# Patient Record
Sex: Male | Born: 1985 | Race: Black or African American | Hispanic: No | Marital: Single | Smoking: Current some day smoker
Health system: Southern US, Community
[De-identification: ages and names within clinical notes are randomized; demographics above are authoritative.]

---

## 2013-10-05 ENCOUNTER — Encounter (HOSPITAL_COMMUNITY): Payer: Self-pay | Admitting: Emergency Medicine

## 2013-10-05 ENCOUNTER — Emergency Department (HOSPITAL_COMMUNITY)

## 2013-10-05 ENCOUNTER — Emergency Department (HOSPITAL_COMMUNITY)
Admission: EM | Admit: 2013-10-05 | Discharge: 2013-10-06 | Attending: Emergency Medicine | Admitting: Emergency Medicine

## 2013-10-05 DIAGNOSIS — S01309A Unspecified open wound of unspecified ear, initial encounter: Secondary | ICD-10-CM | POA: Diagnosis not present

## 2013-10-05 DIAGNOSIS — S01409A Unspecified open wound of unspecified cheek and temporomandibular area, initial encounter: Secondary | ICD-10-CM | POA: Insufficient documentation

## 2013-10-05 DIAGNOSIS — F17200 Nicotine dependence, unspecified, uncomplicated: Secondary | ICD-10-CM | POA: Diagnosis not present

## 2013-10-05 DIAGNOSIS — S0181XA Laceration without foreign body of other part of head, initial encounter: Secondary | ICD-10-CM

## 2013-10-05 DIAGNOSIS — S0003XA Contusion of scalp, initial encounter: Secondary | ICD-10-CM | POA: Diagnosis not present

## 2013-10-05 MED ORDER — IBUPROFEN 800 MG PO TABS
800.0000 mg | ORAL_TABLET | Freq: Once | ORAL | Status: AC
  Administered 2013-10-06: 800 mg via ORAL
  Filled 2013-10-05: qty 1
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
## 2013-10-06 MED ORDER — LIDOCAINE-EPINEPHRINE (PF) 2 %-1:200000 IJ SOLN
INTRAMUSCULAR | Status: AC
  Filled 2013-10-06: qty 20

## 2013-10-06 MED ORDER — IBUPROFEN 800 MG PO TABS: 800.0000 mg | ORAL_TABLET | Freq: Three times a day (TID) | ORAL | Status: DC | PRN

## 2013-10-06 MED ORDER — BACITRACIN-NEOMYCIN-POLYMYXIN 400-5-5000 EX OINT
TOPICAL_OINTMENT | CUTANEOUS | Status: AC
  Filled 2013-10-06: qty 1

## 2013-10-06 MED ORDER — BACITRACIN-NEOMYCIN-POLYMYXIN 400-5-5000 EX OINT
TOPICAL_OINTMENT | Freq: Once | CUTANEOUS | Status: AC
  Administered 2013-10-06: 1 via TOPICAL
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
## 2013-10-06 NOTE — ED Provider Notes (Signed)
Medical screening examination/treatment/procedure(s) were performed by non-physician practitioner and as supervising physician I was immediately available for consultation/collaboration.   Dione Booze, MD 10/06/13 [id number]
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
## 2015-01-05 ENCOUNTER — Encounter (HOSPITAL_COMMUNITY): Payer: Self-pay | Admitting: Adult Health

## 2015-01-05 ENCOUNTER — Emergency Department (HOSPITAL_COMMUNITY)
Admission: EM | Admit: 2015-01-05 | Discharge: 2015-01-05 | Disposition: A | Discharge status: Home / Self Care | Attending: Emergency Medicine | Admitting: Emergency Medicine

## 2015-01-05 DIAGNOSIS — Z720 Tobacco use: Secondary | ICD-10-CM | POA: Insufficient documentation

## 2015-01-05 DIAGNOSIS — Y998 Other external cause status: Secondary | ICD-10-CM | POA: Insufficient documentation

## 2015-01-05 DIAGNOSIS — Y929 Unspecified place or not applicable: Secondary | ICD-10-CM | POA: Insufficient documentation

## 2015-01-05 DIAGNOSIS — X58XXXA Exposure to other specified factors, initial encounter: Secondary | ICD-10-CM | POA: Insufficient documentation

## 2015-01-05 DIAGNOSIS — Z202 Contact with and (suspected) exposure to infections with a predominantly sexual mode of transmission: Secondary | ICD-10-CM

## 2015-01-05 DIAGNOSIS — S39012A Strain of muscle, fascia and tendon of lower back, initial encounter: Secondary | ICD-10-CM | POA: Insufficient documentation

## 2015-01-05 DIAGNOSIS — R350 Frequency of micturition: Secondary | ICD-10-CM | POA: Insufficient documentation

## 2015-01-05 DIAGNOSIS — Y939 Activity, unspecified: Secondary | ICD-10-CM | POA: Insufficient documentation

## 2015-01-05 MED ORDER — AZITHROMYCIN 250 MG PO TABS
1000.0000 mg | ORAL_TABLET | Freq: Once | ORAL | Status: AC
  Administered 2015-01-05: 1000 mg via ORAL
  Filled 2015-01-05: qty 4

## 2015-01-05 MED ORDER — STERILE WATER FOR INJECTION IJ SOLN
2.0000 mL | Freq: Once | INTRAMUSCULAR | Status: AC
  Administered 2015-01-05: 2 mL via INTRAMUSCULAR

## 2015-01-05 MED ORDER — CEFTRIAXONE SODIUM 250 MG IJ SOLR
250.0000 mg | Freq: Once | INTRAMUSCULAR | Status: AC
  Administered 2015-01-05: 250 mg via INTRAMUSCULAR
  Filled 2015-01-05: qty 250

## 2015-01-05 MED ORDER — IBUPROFEN 800 MG PO TABS: 800.0000 mg | ORAL_TABLET | Freq: Three times a day (TID) | ORAL | Status: AC

## 2015-01-05 MED ORDER — STERILE WATER FOR INJECTION IJ SOLN
INTRAMUSCULAR | Status: AC
  Administered 2015-01-05: 17:00:00 2 mL via INTRAMUSCULAR
  Filled 2015-01-05: qty 10

## 2015-01-05 MED ORDER — IBUPROFEN 400 MG PO TABS
800.0000 mg | ORAL_TABLET | Freq: Once | ORAL | Status: AC
  Administered 2015-01-05: 800 mg via ORAL
  Filled 2015-01-05: qty 4

## 2015-01-05 NOTE — ED Notes (Signed)
Declined W/C at D/C and was escorted to lobby by RN. 

## 2015-01-05 NOTE — Discharge Instructions (Signed)
Back Pain, Adult Low back pain is very common. About 1 in 5 people have back pain.The cause of low back pain is rarely dangerous. The pain often gets better over time.About half of people with a sudden onset of back pain feel better in just 2 weeks. About 8 in 10 people feel better by 6 weeks.  CAUSES Some common causes of back pain include:  Strain of the muscles or ligaments supporting the spine.  Wear and tear (degeneration) of the spinal discs.  Arthritis.  Direct injury to the back. DIAGNOSIS Most of the time, the direct cause of low back pain is not known.However, back pain can be treated effectively even when the exact cause of the pain is unknown.Answering your caregiver's questions about your overall health and symptoms is one of the most accurate ways to make sure the cause of your pain is not dangerous. If your caregiver needs more information, he or she may order lab work or imaging tests (X-rays or MRIs).However, even if imaging tests show changes in your back, this usually does not require surgery. HOME CARE INSTRUCTIONS For many people, back pain returns.Since low back pain is rarely dangerous, it is often a condition that people can learn to [facility] their own.   Remain active. It is stressful on the back to sit or stand in one place. Do not sit, drive, or stand in one place for more than 30 minutes at a time. Take short walks on level surfaces as soon as pain allows.Try to increase the length of time you walk each day.  Do not stay in bed.Resting more than 1 or 2 days can delay your recovery.  Do not avoid exercise or work.Your body is made to move.It is not dangerous to be active, even though your back may hurt.Your back will likely heal faster if you return to being active before your pain is gone.  Pay attention to your body when you bend and lift. Many people have less discomfortwhen lifting if they bend their knees, keep the load close to their bodies,and  avoid twisting. Often, the most comfortable positions are those that put less stress on your recovering back.  Find a comfortable position to sleep. Use a firm mattress and lie on your side with your knees slightly bent. If you lie on your back, put a pillow under your knees.  Only take over-the-counter or prescription medicines as directed by your caregiver. Over-the-counter medicines to reduce pain and inflammation are often the most helpful.Your caregiver may prescribe muscle relaxant drugs.These medicines help dull your pain so you can more quickly return to your normal activities and healthy exercise.  Put ice on the injured area.  Put ice in a plastic bag.  Place a towel between your skin and the bag.  Leave the ice on for 15-20 minutes, 03-04 times a day for the first 2 to 3 days. After that, ice and heat may be alternated to reduce pain and spasms.  Ask your caregiver about trying back exercises and gentle massage. This may be of some benefit.  Avoid feeling anxious or stressed.Stress increases muscle tension and can worsen back pain.It is important to recognize when you are anxious or stressed and learn ways to manage it.Exercise is a great option. SEEK MEDICAL CARE IF:  You have pain that is not relieved with rest or medicine.  You have pain that does not improve in 1 week.  You have new symptoms.  You are generally not feeling well. SEEK  IMMEDIATE MEDICAL CARE IF:   You have pain that radiates from your back into your legs.  You develop new bowel or bladder control problems.  You have unusual weakness or numbness in your arms or legs.  You develop nausea or vomiting.  You develop abdominal pain.  You feel faint. Document Released: 11/02/2005 Document Revised: 05/03/2012 Document Reviewed: 03/06/2014 [facility] Patient Information 2015 [place], [place]. This information is not intended to replace advice given to you by your health care provider. Make sure you  discuss any questions you have with your health care provider.  Gonorrhea Gonorrhea is an infection that can cause serious problems. If left untreated, the infection may:   Damage the male or male organs.   Cause women to be unable to have children (sterility).   Harm a fetus if the infected woman is pregnant.  It is important to get treatment for gonorrhea as soon as possible. It is also necessary that all your sexual partners be tested for the infection.  CAUSES  Gonorrhea is caused by bacteria called Neisseria gonorrhoeae. The infection is spread from person to person, usually by sexual contact (such as by anal, vaginal, or oral means). A newborn can contract the infection from his or her mother during birth.  SYMPTOMS  Some people with gonorrhea do not have symptoms. Symptoms may be different in females and males.  Females The most common symptoms are:   Pain in the lower abdomen.   Fever with or without chills.  Other symptoms include:   Abnormal vaginal discharge.   Painful intercourse.   Burning or itching of the vagina or lips of the vagina.   Abnormal vaginal bleeding.   Pain when urinating.   Long-lasting (chronic) pain in the lower abdomen, especially during menstruation or intercourse.   Inability to become pregnant.   Going into premature labor.   Irritation, pain, bleeding, or discharge from the rectum. This may occur if the infection was spread by anal sex.   Sore throat or swollen lymph nodes in the neck. This may occur if the infection was spread by oral sex.  Males The most common symptoms are:   Discharge from the penis.   Pain or burning during urination.   Pain or swelling in the testicles. Other symptoms may include:   Irritation, pain, bleeding, or discharge from the rectum. This may occur if the infection was spread by anal sex.   Sore throat, fever, or swollen lymph nodes in the neck. This may occur if the infection  was spread by oral sex.  DIAGNOSIS  A diagnosis is made after a physical exam is done and a sample of discharge is examined under a microscope for the presence of the bacteria. The discharge may be taken from the urethra, cervix, throat, or rectum.  TREATMENT  Gonorrhea is treated with antibiotic medicines. It is important for treatment to begin as soon as possible. Early treatment may prevent some problems from developing.  HOME CARE INSTRUCTIONS   Take medicines only as directed by your health care provider.   Take your antibiotic medicine as directed by your health care provider. Finish the antibiotic even if you start to feel better. Incomplete treatment will put you at risk for continued infection.   Do not have sex until treatment is complete or as directed by your health care provider.   Keep all follow-up visits as directed by your health care provider.   Not all test results are available during your visit. If your  test results are not back during the visit, make an appointment with your health care provider to find out the results. Do not assume everything is normal if you have not heard from your health care provider or the medical facility. It is your responsibility to get your test results.  If you test positive for gonorrhea, inform your recent sexual partners. They need to be checked for gonorrhea even if they do not have symptoms. They may need treatment, even if they test negative for gonorrhea.  SEEK MEDICAL CARE IF:   You develop any bad reaction to the medicine you were prescribed. This may include:   A rash.   Nausea.   Vomiting.   Diarrhea.   Your symptoms do not improve after a few days of taking antibiotics.   Your symptoms get worse.   You develop increased pain, such as in the testicles (for males) or in the abdomen (for females).  You have a fever. MAKE SURE YOU:   Understand these instructions.  Will watch your condition.  Will get  help right away if you are not doing well or get worse. Document Released: 10/30/2000 Document Revised: 03/19/2014 Document Reviewed: 05/10/2013 [facility] Patient Information 2015 [place], [place]. This information is not intended to replace advice given to you by your health care provider. Make sure you discuss any questions you have with your health care provider.   Emergency Department Resource Guide 1) Find a Doctor and Pay Out of Pocket Although you won't have to find out who is covered by your insurance plan, it is a good idea to ask around and get recommendations. You will then need to call the office and see if the doctor you have chosen will accept you as a new patient and what types of options they offer for patients who are self-pay. Some doctors offer discounts or will set up payment plans for their patients who do not have insurance, but you will need to ask so you aren't surprised when you get to your appointment.  2) Contact Your Local Health Department Not all health departments have doctors that can see patients for sick visits, but many do, so it is worth a call to see if yours does. If you don't know where your local health department is, you can check in your phone book. The CDC also has a tool to help you locate your state's health department, and many state websites also have listings of all of their local health departments.  3) Find a Walk-in Clinic If your illness is not likely to be very severe or complicated, you may want to try a walk in clinic. These are popping up all over the country in pharmacies, drugstores, and shopping centers. They're usually staffed by nurse practitioners or physician assistants that have been trained to treat common illnesses and complaints. They're usually fairly quick and inexpensive. However, if you have serious medical issues or chronic medical problems, these are probably not your best option.  No Primary Care Doctor: - Call Health Connect at   [phone] - they can help you locate a primary care doctor that  accepts your insurance, provides certain services, etc. - Physician Referral Service- [phone]  Chronic Pain Problems: Organization         Address  Phone   Notes  [facility]  [phone] Patients need to be referred by their primary care doctor.   Medication Assistance: Organization         Address  Phone   Notes  [facility] Medication [facility] [address] [place] [place], [place] [address] [phone] --Must be a resident of [facility] -- Must have NO insurance coverage whatsoever (no Medicaid/ Medicare, etc.) -- The pt. MUST have a primary care doctor that directs their care regularly and follows them in the community   [facility]  [phone]   [employer]  [phone]    Agencies that provide inexpensive medical care: Organization         Address  Phone   Notes  [facility]  [phone]   [facility]    [phone]   [facility] [address] [place], [place] [address] [phone]   [facility] [address] [place] [place] [phone]   Planned Parenthood    [phone]   [facility]    [phone]   [facility]  [address], [place] Phone:  [phone], Fax:  [phone] Hours of Operation:  9 am - 6 pm, M-F.  Also accepts Medicaid/Medicare and self-pay.  [facility]  [address], [place] Phone: [phone], Fax: [phone]. Hours of Operation:  8:30 am - 5:30 pm, M-F.  Also accepts Medicaid and self-pay.  [facility] [place] [address] [place] Phone: [phone]   [facility] [address] Natasha Bence [place], [place] [phone], Ext. [phone] Mondays & Thursdays: 7-9 AM.  First 15 patients are seen on a first come, first serve basis.     Medicaid-accepting [facility] Providers:  Organization         Address  Phone   Notes  [facility] [address], [place] [phone] Also accepts self-pay patients.  [facility] [address] Laurell Josephs [place], [place]  [phone]   [facility] [address], [place] [phone]   [facility] [address], [place] [phone]   Renaye Rakers [address], [place]   [phone] Only accepts [place] patients after they have their name applied to their card.   Self-Pay (no insurance) in [facility]:  Organization         Address  Phone   Notes  Sickle Cell Patients, [facility] [address] [place], [place] [phone]   [facility] [address] [place], [place] [phone]   [facility] [place]  [address], [place] [phone]   [facility]/Dr. Osei-Bonsu  [address] [place] or [address], [place] [phone] Phone number for both [place] and [place] locations is the same.  [facility]gent [facility] and [facility] [address], [place] [phone]   [facility] [address] [place] or [address] [phone]   [facility] [address] [place] [phone], phone; [phone], fax Sees patients 1st and 3rd Saturday of every month.  Must not qualify for public or private insurance (i.e. Medicaid, Medicare, [place] Health Choice, Veterans' Benefits)  Household income should be no more than 200% of the poverty level The clinic cannot treat you if you are pregnant or think you are pregnant  Sexually transmitted diseases are not treated at the clinic.    Dental Care: Organization         Address  Phone  Notes  [facility]  Department of [facility] [address] [place], [place] [phone] Accepts children up to age 34 who are enrolled in [place] or [place] Health Choice; pregnant women with a Medicaid card; and children who have applied for Medicaid or [place] Health Choice, but were declined, whose parents can pay a reduced fee at time of service.  [facility] Department of [facility]  [address], [place] [phone] Accepts children up to age 66 who are enrolled in [place] or [place] Health Choice; pregnant women with a Medicaid card; and children who have applied for Medicaid or [place] Health Choice, but were declined, whose parents can pay a reduced fee at time of service.  [facility]  [address] [place], [place] [phone] Patients are seen by appointment only. Walk-ins are not accepted. [facility] will see patients 57 years of age and older. Monday - Tuesday (8am-5pm) Most Wednesdays (8:30-5pm) $30 per visit, cash only  [facility] Adult Dental Access PROGRAM  [address], [facility] [phone] Patients are seen by appointment only. Walk-ins are not accepted. [facility] will see patients 38 years of age and older. One Wednesday Evening (Monthly: Volunteer Based).  $30 per visit, cash only  [employer]  [phone] for adults; Children under age 77, call Graduate Pediatric Dentistry at [phone]. Children aged 56-14, please call [phone] to request a pediatric application.  Dental services are provided in all areas of dental care including fillings, crowns and bridges, complete and partial dentures, implants, gum treatment, root canals, and extractions. Preventive care is also provided. Treatment is provided to both adults and children. Patients are selected via a lottery and there is often a waiting list.   [facility] [address], [place]  [phone]  [email or web address]   [facility] [address] [place], [place] [phone], Ext. [phone] Second and Fourth Thursday of each month, opens at 6:30 AM; Clinic ends at 9 AM.  Patients are seen on a first-come first-served basis, and a limited number are seen during each clinic.   [facility]  [address] Ether Griffins [place], [place] [phone]   Eligibility Requirements You must have lived in [place], [place], or [place] counties for at least the last three months.   You cannot be eligible for state or federal sponsored [place], including [employer], [place], or [employer].   You generally cannot be eligible for healthcare insurance through your employer.    How to apply: Eligibility screenings are held every Tuesday and Wednesday afternoon from 1:00 pm until 4:00 pm. You do not need an appointment for the interview!  [facility] [address], [place], [place] [phone]   [facility]  [phone]   [facility] Health [facility]  [phone]   [facility] Health Department  [phone]    Behavioral Health Resources in the Community: Intensive Outpatient Programs Organization         Address  Phone  Notes  [facility] [address] [place], [place] [phone]   [facility] [address] [place], [place] [phone]   [facility] [address] [place], [place]  [phone]   [facility] [address],  [place], [place] [phone] or [phone]   Substance Abuse Resources Organization         Address  Phone  Notes  [facility]  [phone]   [facility]  [phone]   [facility]  [phone]   [facility]  [phone]   Residential & Outpatient Substance Abuse Program  [phone]   Psychological Services Organization          Address  Phone  Notes  [facility]  [phone]- [phone]   [facility]  [phone]- [phone]   [facility] [address], [place] [phone] or [phone]    Mobile Crisis Teams Organization         Address  Phone  Notes  [facility], Mobile Crisis Care Unit  [phone]   [facility]  [address] [place], [place] [phone]   Doristine LocksSharon DeEsch [address] [place] [phone]    Self-Help/Support Groups Organization         Address  Phone             Notes  [facility] [place] - variety of support groups  336- [phone] Call for more information  Narcotics Anonymous (NA), [facility] [address], [place]  2 meetings at this location   Statisticianesidential Treatment Programs Organization         Address  Phone  Notes  [facility] [address],    [place]  [phone]   [facility]  [address], [place] [phone], [place], [place] [phone]   [facility] [address], [place] [phone] Admissions: 8am-3pm M-F  [facility] [address]    [place], [place] [phone]   [facility] [address], [place], [place] [phone]   [facility] [address]  [place], [place] [phone]   [facility] - Intensive Outpatient [address], [place], [place] [phone]   [facility] ([facility] [address]  [place], [place] [phone] or [phone]   [facility] ([facility]) [address] [place], [place] [phone] Accepts Medicaid  [facility] [address]  [place] [phone] Substance Abuse/Addiction Treatment   [facility] Resources Organization         Address  Phone  Notes  [facility]  [phone]   Angie FavaJulie Brannon, PhD [address] [place], [place]   [phone](336) [phone] or [phone]   [facility]   [address] [place], [place] [phone]) [phone]   [facility] [address], [place], [place] [phone]) [phone] Insurance/Medicaid/sponsorship through [facility]  [facility] [address]                                    [place], [place] [phone] Therapy/tele-psych/case  [facility] [address]   [place], [place] [phone]) [phone]    Dr. Lolly MustacheArfeen  [phone](336) [phone]   [facility]  [employer] [facility] 1) [address], [place] 2) [address], [place] 3)  [address], [place] [phone](336) [phone]  [phone](336) [phone]   [facility] Child Abuse Hotline [phone](336) [phone] or [phone]  [phone] (After Hours)

## 2015-01-05 NOTE — ED Notes (Signed)
Presents with exposure to Gonorrhea from current sexual partner. Denies symptoms-endorses slight back pain.

## 2015-01-05 NOTE — ED Provider Notes (Signed)
CSN: [id number]     Arrival date & time 01/05/15  1547 History  This chart was scribed for a non-physician practitioner, Eben Burow, PA-C working with Elwin Mocha, MD by [place] Peace, ED Scribe. The patient was seen in TR09C/TR09C. The patient's care was started at 4:23 PM.    Chief Complaint  Patient presents with  . Exposure to STD      Patient is a 29 y.o. male presenting with STD exposure. The history is provided by the patient. No language interpreter was used.  Exposure to STD This is a new problem. The current episode started more than 2 days ago. The problem has been gradually worsening. Pertinent negatives include no abdominal pain. He has tried nothing for the symptoms.    HPI Comments: Alexander Stein is a 29 y.o. male who presents to the Emergency Department complaining of possible exposure to Gonorrhea onset this week. He reports experiencing lower left sided back, increased frequency in urination, and nausea. No complaints of fever, chills, vomiting, or abdominal pain. He further denies any complaints of dysuria, penile discharge, rash, rectal pain, constipation, or diarrhea. Pt is current everyday smoker.    History reviewed. No pertinent past medical history. History reviewed. No pertinent past surgical history. History reviewed. No pertinent family history. History  Substance Use Topics  . Smoking status: Current Some Day Smoker  . Smokeless tobacco: Not on file  . Alcohol Use: No    Review of Systems  Constitutional: Negative for fever and chills.  Gastrointestinal: Positive for nausea. Negative for vomiting, abdominal pain, diarrhea and constipation.  Genitourinary: Positive for frequency. Negative for dysuria, urgency, discharge, penile swelling, difficulty urinating and testicular pain.  All other systems reviewed and are negative.     Allergies  Review of patient's allergies indicates no known allergies.  Home Medications   Prior to Admission  medications   Medication Sig Start Date End Date Taking? Authorizing Provider  ibuprofen (ADVIL,MOTRIN) 800 MG tablet Take 1 tablet (800 mg total) by mouth 3 (three) times daily. 01/05/15   Marylynn Rigdon A Forcucci, PA-C   BP 153/98 mmHg  Pulse 79  Temp(Src) 97.7 F (36.5 C) (Oral)  Ht  (1.956 m)  Wt 190 lb (86.183 kg)  BMI 22.53 kg/m2  SpO2 100% Physical Exam  Constitutional: He is oriented to person, place, and time. He appears well-developed and well-nourished. No distress.  HENT:  Head: Normocephalic and atraumatic.  Eyes: Conjunctivae and EOM are normal.  Neck: Neck supple. No JVD present. No thyromegaly present.  Cardiovascular: Normal rate and regular rhythm.  Exam reveals no gallop and no friction rub.   No murmur heard. Pulmonary/Chest: Effort normal and breath sounds normal. No respiratory distress. He has no wheezes. He has no rales. He exhibits no tenderness.  Abdominal: Soft. Bowel sounds are normal. He exhibits no distension and no mass. There is no tenderness. There is no rebound and no guarding.  Genitourinary: Testes normal and penis normal. Right testis shows no mass, no swelling and no tenderness. Right testis is descended. Cremasteric reflex is not absent on the right side. Left testis shows no mass, no swelling and no tenderness. Left testis is descended. Cremasteric reflex is not absent on the left side. Circumcised. No penile erythema or penile tenderness. No discharge found.  Musculoskeletal: Normal range of motion.  Patient rises slowly from sitting to standing.  They walk without an antalgic gait.  There is no evidence of erythema, ecchymosis, or gross deformity.  There is  no tenderness to palpation over bony spine or bilateral paraspinal muscles.  Active ROM is full.  Sensation to light touch is intact over all extremities.  Strength is symmetric and equal in all extremities.    Lymphadenopathy:    He has no cervical adenopathy.  Neurological: He is alert and  oriented to person, place, and time.  Skin: Skin is warm and dry.  Psychiatric: He has a normal mood and affect. His behavior is normal. Judgment and thought content normal.  Nursing note and vitals reviewed.   ED Course  Procedures (including critical care time) Labs Review Labs Reviewed  URINALYSIS, ROUTINE W REFLEX MICROSCOPIC  GC/CHLAMYDIA PROBE AMP ([place])    Imaging Review No results found.   EKG Interpretation None     Medications  cefTRIAXone (ROCEPHIN) injection 250 mg (not administered)  azithromycin (ZITHROMAX) tablet 1,000 mg (not administered)  ibuprofen (ADVIL,MOTRIN) tablet 800 mg (not administered)    4:27 PM- Treatment plan was discussed with patient who verbalizes understanding and agrees.   MDM   Final diagnoses:  STD exposure  Low back strain, initial encounter   Patient's 29 year old male who presents emergency room for evaluation of STD exposure. His girlfriend was diagnosed with gonorrhea. Physical exam is unremarkable. Patient has no focal neurological deficits on examination. Suspect that this is likely a back strain. Given no bony tenderness will not perform imaging. GC is pending as well as urinalysis. We'll treat prophylactically with azithromycin and ceftriaxone injection. Patient given referral to the health Department. Patient instructed to take ibuprofen as needed for back pain. In return for symptoms of cauda equina, abdominal pain, penile discharge, testicular pain, or intractable nausea and vomiting. He states understanding and agreement at this time.  I personally performed the services described in this documentation, which was scribed in my presence. The recorded information has been reviewed and is accurate.    Eben Burowourtney A Forcucci, PA-C 01/05/15 1636  Elwin MochaBlair Walden, MD 01/05/15 [phone]

## 2015-01-07 LAB — GC/CHLAMYDIA PROBE AMP (~~LOC~~) NOT AT ARMC
Chlamydia: NEGATIVE
Neisseria Gonorrhea: POSITIVE — AB

## 2015-01-08 ENCOUNTER — Telehealth (HOSPITAL_COMMUNITY): Payer: Self-pay

## 2015-01-08 NOTE — ED Notes (Signed)
Positive for gonorrhea. Treated per protocol. DHHS form faxed. Attempting to contact pt.  

## 2015-01-10 ENCOUNTER — Telehealth (HOSPITAL_COMMUNITY): Payer: Self-pay

## 2015-01-10 NOTE — ED Notes (Signed)
Unable to contact pt. Unable to communicate lab results

## 2015-06-21 ENCOUNTER — Emergency Department (HOSPITAL_COMMUNITY): Admission: EM | Admit: 2015-06-21 | Discharge: 2015-06-22 | Discharge status: Absent without leave

## 2015-06-22 ENCOUNTER — Emergency Department (HOSPITAL_COMMUNITY)
Admission: EM | Admit: 2015-06-22 | Discharge: 2015-06-22 | Disposition: A | Discharge status: Home / Self Care | Attending: Emergency Medicine | Admitting: Emergency Medicine

## 2015-06-22 ENCOUNTER — Encounter (HOSPITAL_COMMUNITY): Payer: Self-pay | Admitting: Family Medicine

## 2015-06-22 DIAGNOSIS — Z720 Tobacco use: Secondary | ICD-10-CM | POA: Insufficient documentation

## 2015-06-22 DIAGNOSIS — M549 Dorsalgia, unspecified: Secondary | ICD-10-CM | POA: Insufficient documentation

## 2015-06-22 DIAGNOSIS — Z202 Contact with and (suspected) exposure to infections with a predominantly sexual mode of transmission: Secondary | ICD-10-CM

## 2015-06-22 DIAGNOSIS — R300 Dysuria: Secondary | ICD-10-CM

## 2015-06-22 DIAGNOSIS — Z791 Long term (current) use of non-steroidal anti-inflammatories (NSAID): Secondary | ICD-10-CM | POA: Insufficient documentation

## 2015-06-22 LAB — I-STAT CHEM 8, ED
BUN: 8 mg/dL (ref 6–20)
CALCIUM ION: 1.27 mmol/L — AB (ref 1.12–1.23)
CHLORIDE: 101 mmol/L (ref 101–111)
CREATININE: 1.1 mg/dL (ref 0.61–1.24)
Glucose, Bld: 60 mg/dL — ABNORMAL LOW (ref 65–99)
HEMATOCRIT: 49 % (ref 39.0–52.0)
HEMOGLOBIN: 16.7 g/dL (ref 13.0–17.0)
Potassium: 4 mmol/L (ref 3.5–5.1)
SODIUM: 142 mmol/L (ref 135–145)
TCO2: 29 mmol/L (ref 0–100)

## 2015-06-22 LAB — URINALYSIS, ROUTINE W REFLEX MICROSCOPIC
Bilirubin Urine: NEGATIVE
GLUCOSE, UA: NEGATIVE mg/dL
Hgb urine dipstick: NEGATIVE
Ketones, ur: NEGATIVE mg/dL
Nitrite: NEGATIVE
PROTEIN: NEGATIVE mg/dL
Specific Gravity, Urine: 1.022 (ref 1.005–1.030)
Urobilinogen, UA: 0.2 mg/dL (ref 0.0–1.0)
pH: 6 (ref 5.0–8.0)

## 2015-06-22 LAB — URINE MICROSCOPIC-ADD ON

## 2015-06-22 MED ORDER — AZITHROMYCIN 250 MG PO TABS
1000.0000 mg | ORAL_TABLET | Freq: Once | ORAL | Status: AC
  Administered 2015-06-22: 1000 mg via ORAL
  Filled 2015-06-22: qty 4

## 2015-06-22 MED ORDER — CEFTRIAXONE SODIUM 250 MG IJ SOLR
250.0000 mg | Freq: Once | INTRAMUSCULAR | Status: AC
  Administered 2015-06-22: 250 mg via INTRAMUSCULAR
  Filled 2015-06-22: qty 250

## 2015-06-22 MED ORDER — LIDOCAINE HCL (PF) 1 % IJ SOLN
5.0000 mL | Freq: Once | INTRAMUSCULAR | Status: AC
  Administered 2015-06-22: 5 mL
  Filled 2015-06-22: qty 5

## 2015-06-22 NOTE — ED Provider Notes (Signed)
CSN: [id number]     Arrival date & time 06/22/15  1243 History  This chart was scribed for Roxy Horseman, PA-C, working with Azalia Bilis, MD by Chestine Spore, ED Scribe. The patient was seen in room TR09C/TR09C at 1:51 PM.    Chief Complaint  Patient presents with  . Back Pain  . SEXUALLY TRANSMITTED DISEASE      The history is provided by the patient. No language interpreter was used.    HPI Comments: Alexander Stein is a 29 y.o. male who presents to the Emergency Department complaining of STD onset 4-5 days. He reports that he has unprotected sex and the partner has not been tested. He states that he is having associated symptoms of low back pain and dysuria. Pt denies penile discharge/pain/swelling, scrotal pain, testicular pain, hematuria, genital lesion, abdominal pain, genital lesions, and any other symptoms.   History reviewed. No pertinent past medical history. History reviewed. No pertinent past surgical history. History reviewed. No pertinent family history. History  Substance Use Topics  . Smoking status: Current Some Day Smoker  . Smokeless tobacco: Not on file  . Alcohol Use: No    Review of Systems  Gastrointestinal: Negative for abdominal pain.  Genitourinary: Positive for dysuria. Negative for hematuria, discharge, penile swelling, scrotal swelling, genital sores, penile pain and testicular pain.  Musculoskeletal: Positive for back pain.  Skin: Negative for color change, rash and wound.      Allergies  Review of patient's allergies indicates no known allergies.  Home Medications   Prior to Admission medications   Medication Sig Start Date End Date Taking? Authorizing Provider  ibuprofen (ADVIL,MOTRIN) 800 MG tablet Take 1 tablet (800 mg total) by mouth 3 (three) times daily. 01/05/15   Courtney Forcucci, PA-C   BP 126/67 mmHg  Pulse 85  Temp(Src) 97.9 F (36.6 C) (Oral)  Resp 18  SpO2 99% Physical Exam  Constitutional: He is oriented to person, place,  and time. He appears well-developed and well-nourished. No distress.  HENT:  Head: Normocephalic and atraumatic.  Eyes: EOM are normal.  Neck: Neck supple. No tracheal deviation present.  Cardiovascular: Normal rate.   Pulmonary/Chest: Effort normal. No respiratory distress.  Genitourinary:  Chaperone present during exam, circumcised male, no obvious penile discharge, no abnormality, mass, or lesion about the scrotum, testes, or penile shaft.  Musculoskeletal: Normal range of motion.  Neurological: He is alert and oriented to person, place, and time.  Skin: Skin is warm and dry.  Psychiatric: He has a normal mood and affect. His behavior is normal.  Nursing note and vitals reviewed.   ED Course  Procedures (including critical care time) DIAGNOSTIC STUDIES: Oxygen Saturation is 99% on RA, nl by my interpretation.    COORDINATION OF CARE: 1:54 PM-Discussed treatment plan which includes GC/chlamydia probe, UA, HIV antibody, I-Stat chem 8, with pt at bedside and pt agreed to plan.   Labs Review Labs Reviewed  URINALYSIS, ROUTINE W REFLEX MICROSCOPIC (NOT AT [facility]) - Abnormal; Notable for the following:    Leukocytes, UA SMALL (*)    All other components within normal limits  I-STAT CHEM 8, ED - Abnormal; Notable for the following:    Glucose, Bld 60 (*)    Calcium, Ion 1.27 (*)    All other components within normal limits  URINE MICROSCOPIC-ADD ON  HIV ANTIBODY (ROUTINE TESTING)  GC/CHLAMYDIA PROBE AMP ([place]) NOT AT [facility]    Imaging Review No results found.   EKG Interpretation None  MDM   Final diagnoses:  STD exposure  Dysuria    Patient with dysuria and possible STD exposure. Will check chemistry, STD screen, and will treat for suspected STD.  I personally performed the services described in this documentation, which was scribed in my presence. The recorded information has been reviewed and is accurate.     Roxy Horseman, PA-C 06/22/15  1510  Azalia Bilis, MD 06/22/15 [phone]

## 2015-06-22 NOTE — ED Notes (Signed)
Declined W/C at D/C and was escorted to lobby by RN. 

## 2015-06-22 NOTE — Discharge Instructions (Signed)
Dysuria °Dysuria is the medical term for pain with urination. There are many causes for dysuria, but urinary tract infection is the most common. If a urinalysis was performed it can show that there is a urinary tract infection. A urine culture confirms that you or your child is sick. You will need to follow up with a healthcare provider because: °· If a urine culture was done you will need to know the culture results and treatment recommendations. °· If the urine culture was positive, you or your child will need to be put on antibiotics or know if the antibiotics prescribed are the right antibiotics for your urinary tract infection. °· If the urine culture is negative (no urinary tract infection), then other causes may need to be explored or antibiotics need to be stopped. °Today laboratory work may have been done and there does not seem to be an infection. If cultures were done they will take at least 24 to 48 hours to be completed. °Today x-rays may have been taken and they read as normal. No cause can be found for the problems. The x-rays may be re-read by a radiologist and you will be contacted if additional findings are made. °You or your child may have been put on medications to help with this problem until you can see your primary caregiver. If the problems get better, see your primary caregiver if the problems return. If you were given antibiotics (medications which kill germs), take all of the mediations as directed for the full course of treatment.  °If laboratory work was done, you need to find the results. Leave a telephone number where you can be reached. If this is not possible, make sure you find out how you are to get test results. °HOME CARE INSTRUCTIONS  °· Drink lots of fluids. For adults, drink eight, 8 ounce glasses of clear juice or water a day. For children, replace fluids as suggested by your caregiver. °· Empty the bladder often. Avoid holding urine for long periods of time. °· After a bowel  movement, women should cleanse front to back, using each tissue only once. °· Empty your bladder before and after sexual intercourse. °· Take all the medicine given to you until it is gone. You may feel better in a few days, but TAKE ALL MEDICINE. °· Avoid caffeine, tea, alcohol and carbonated beverages, because they tend to irritate the bladder. °· In men, alcohol may irritate the prostate. °· Only take over-the-counter or prescription medicines for pain, discomfort, or fever as directed by your caregiver. °· If your caregiver has given you a follow-up appointment, it is very important to keep that appointment. Not keeping the appointment could result in a chronic or permanent injury, pain, and disability. If there is any problem keeping the appointment, you must call back to this facility for assistance. °SEEK IMMEDIATE MEDICAL CARE IF:  °· Back pain develops. °· A fever develops. °· There is nausea (feeling sick to your stomach) or vomiting (throwing up). °· Problems are no better with medications or are getting worse. °MAKE SURE YOU:  °· Understand these instructions. °· Will watch your condition. °· Will get help right away if you are not doing well or get worse. °Document Released: 07/31/2004 Document Revised: 01/25/2012 Document Reviewed: 06/07/2008 °ExitCare® Patient Information ©2015 [employer]. This information is not intended to replace advice given to you by your health care provider. Make sure you discuss any questions you have with your health care provider. °Sexually Transmitted Disease °A   sexually transmitted disease (STD) is a disease or infection that may be passed (transmitted) from person to person, usually during sexual activity. This may happen by way of saliva, semen, blood, vaginal mucus, or urine. Common STDs include:  °· Gonorrhea.   °· Chlamydia.   °· Syphilis.   °· HIV and AIDS.   °· Genital herpes.   °· Hepatitis B and C.   °· Trichomonas.   °· Human papillomavirus (HPV).   °· Pubic  lice.   °· Scabies. °· Mites. °· Bacterial vaginosis. °WHAT ARE CAUSES OF STDs? °An STD may be caused by bacteria, a virus, or parasites. STDs are often transmitted during sexual activity if one person is infected. However, they may also be transmitted through nonsexual means. STDs may be transmitted after:  °· Sexual intercourse with an infected person.   °· Sharing sex toys with an infected person.   °· Sharing needles with an infected person or using unclean piercing or tattoo needles. °· Having intimate contact with the genitals, mouth, or rectal areas of an infected person.   °· Exposure to infected fluids during birth. °WHAT ARE THE SIGNS AND SYMPTOMS OF STDs? °Different STDs have different symptoms. Some people may not have any symptoms. If symptoms are present, they may include:  °· Painful or bloody urination.   °· Pain in the pelvis, abdomen, vagina, anus, throat, or eyes.   °· A skin rash, itching, or irritation. °· Growths, ulcerations, blisters, or sores in the genital and anal areas. °· Abnormal vaginal discharge with or without bad odor.   °· Penile discharge in men.   °· Fever.   °· Pain or bleeding during sexual intercourse.   °· Swollen glands in the groin area.   °· Yellow skin and eyes (jaundice). This is seen with hepatitis.   °· Swollen testicles. °· Infertility. °· Sores and blisters in the mouth. °HOW ARE STDs DIAGNOSED? °To make a diagnosis, your health care provider may:  °· Take a medical history.   °· Perform a physical exam.   °· Take a sample of any discharge to examine. °· Swab the throat, cervix, opening to the penis, rectum, or vagina for testing. °· Test a sample of your first morning urine.   °· Perform blood tests.   °· Perform a Pap test, if this applies.   °· Perform a colposcopy.   °· Perform a laparoscopy.   °HOW ARE STDs TREATED? ° Treatment depends on the STD. Some STDs may be treated but not cured.  °· Chlamydia, gonorrhea, trichomonas, and syphilis can be cured with  antibiotic medicine.   °· Genital herpes, hepatitis, and HIV can be treated, but not cured, with prescribed medicines. The medicines lessen symptoms.   °· Genital warts from HPV can be treated with medicine or by freezing, burning (electrocautery), or surgery. Warts may come back.   °· HPV cannot be cured with medicine or surgery. However, abnormal areas may be removed from the cervix, vagina, or vulva.   °· If your diagnosis is confirmed, your recent sexual partners need treatment. This is true even if they are symptom-free or have a negative culture or evaluation. They should not have sex until their health care providers say it is okay. °HOW CAN I REDUCE MY RISK OF GETTING AN STD? °Take these steps to reduce your risk of getting an STD: °· Use latex condoms, dental dams, and water-soluble lubricants during sexual activity. Do not use petroleum jelly or oils. °· Avoid having multiple sex partners. °· Do not have sex with someone who has other sex partners. °· Do not have sex with anyone you do not know or who is at high risk for   an STD. °· Avoid risky sex practices that can break your skin. °· Do not have sex if you have open sores on your mouth or skin. °· Avoid drinking too much alcohol or taking illegal drugs. Alcohol and drugs can affect your judgment and put you in a vulnerable position. °· Avoid engaging in oral and anal sex acts. °· Get vaccinated for HPV and hepatitis. If you have not received these vaccines in the past, talk to your health care provider about whether one or both might be right for you.   °· If you are at risk of being infected with HIV, it is recommended that you take a prescription medicine daily to prevent HIV infection. This is called pre-exposure prophylaxis (PrEP). You are considered at risk if: °¨ You are a man who has sex with other men (MSM). °¨ You are a heterosexual man or woman and are sexually active with more than one partner. °¨ You take drugs by injection. °¨ You are  sexually active with a partner who has HIV. °· Talk with your health care provider about whether you are at high risk of being infected with HIV. If you choose to begin PrEP, you should first be tested for HIV. You should then be tested every 3 months for as long as you are taking PrEP.   °WHAT SHOULD I DO IF I THINK I HAVE AN STD? °· See your health care provider.   °· Tell your sexual partner(s). They should be tested and treated for any STDs. °· Do not have sex until your health care provider says it is okay.  °WHEN SHOULD I GET IMMEDIATE MEDICAL CARE? °Contact your health care provider right away if:  °· You have severe abdominal pain. °· You are a man and notice swelling or pain in your testicles. °· You are a woman and notice swelling or pain in your vagina. °Document Released: 01/23/2003 Document Revised: 11/07/2013 Document Reviewed: 05/23/2013 °ExitCare® Patient Information ©2015 [employer]. This information is not intended to replace advice given to you by your health care provider. Make sure you discuss any questions you have with your health care provider. ° °

## 2015-06-22 NOTE — ED Notes (Signed)
Pt here for lower back pain and sts possible STD. sts burning when he pees.

## 2015-06-23 LAB — HIV ANTIBODY (ROUTINE TESTING W REFLEX): HIV SCREEN 4TH GENERATION: NONREACTIVE

## 2015-06-24 LAB — GC/CHLAMYDIA PROBE AMP (~~LOC~~) NOT AT ARMC
CHLAMYDIA, DNA PROBE: POSITIVE — AB
NEISSERIA GONORRHEA: POSITIVE — AB

## 2015-06-24 LAB — URINE CULTURE: Culture: NO GROWTH

## 2015-06-25 ENCOUNTER — Telehealth (HOSPITAL_COMMUNITY): Payer: Self-pay

## 2015-06-25 NOTE — Telephone Encounter (Signed)
Positive for gonorrhea and chlamydia. Treated per protocol. DHHS form faxed. No longer available at number listed.  Letter sent to address on file.

## 2018-05-24 ENCOUNTER — Encounter (HOSPITAL_COMMUNITY): Payer: Self-pay | Admitting: *Deleted

## 2018-05-24 ENCOUNTER — Emergency Department (HOSPITAL_COMMUNITY)
Admission: EM | Admit: 2018-05-24 | Discharge: 2018-05-24 | Disposition: A | Discharge status: Home / Self Care | Attending: Emergency Medicine | Admitting: Emergency Medicine

## 2018-05-24 DIAGNOSIS — Y999 Unspecified external cause status: Secondary | ICD-10-CM | POA: Insufficient documentation

## 2018-05-24 DIAGNOSIS — Y939 Activity, unspecified: Secondary | ICD-10-CM | POA: Insufficient documentation

## 2018-05-24 DIAGNOSIS — Y929 Unspecified place or not applicable: Secondary | ICD-10-CM | POA: Insufficient documentation

## 2018-05-24 DIAGNOSIS — W57XXXA Bitten or stung by nonvenomous insect and other nonvenomous arthropods, initial encounter: Secondary | ICD-10-CM | POA: Insufficient documentation

## 2018-05-24 DIAGNOSIS — L02212 Cutaneous abscess of back [any part, except buttock]: Secondary | ICD-10-CM

## 2018-05-24 DIAGNOSIS — Z79899 Other long term (current) drug therapy: Secondary | ICD-10-CM | POA: Insufficient documentation

## 2018-05-24 DIAGNOSIS — F17210 Nicotine dependence, cigarettes, uncomplicated: Secondary | ICD-10-CM | POA: Insufficient documentation

## 2018-05-24 MED ORDER — DOXYCYCLINE HYCLATE 100 MG PO TABS
100.0000 mg | ORAL_TABLET | Freq: Once | ORAL | Status: AC
  Administered 2018-05-24: 100 mg via ORAL
  Filled 2018-05-24: qty 1

## 2018-05-24 MED ORDER — DOXYCYCLINE HYCLATE 100 MG PO CAPS: 100.0000 mg | ORAL_CAPSULE | Freq: Two times a day (BID) | ORAL | 0 refills | Status: AC

## 2018-05-24 NOTE — Discharge Instructions (Addendum)
You have an abscess on your back.  It will drain pus for several days.  You can shower.  Simple dressing.  Antibiotic twice a day for 10 days.

## 2018-05-24 NOTE — ED Provider Notes (Signed)
[facility]PART[facility] Provider Note   CSN: [id number] Arrival date & time: 05/24/18  1954     History   Chief Complaint Chief Complaint  Patient presents with  . Insect Bite    HPI Rudie Meyeryshawn Stumpo is a 32 y.o. male.  Status post" spider bite" on right upper back 4 to 5 days ago.  Wound is now draining pus.  Patient feels weak, but no fever, sweats, chills.  He is normally healthy.  Severity is moderate.     History reviewed. No pertinent past medical history.  There are no active problems to display for this patient.   History reviewed. No pertinent surgical history.      Home Medications    Prior to Admission medications   Medication Sig Start Date End Date Taking? Authorizing Provider  doxycycline (VIBRAMYCIN) 100 MG capsule Take 1 capsule (100 mg total) by mouth 2 (two) times daily. 05/24/18   Donnetta Hutchingook, Lexani Corona, MD  ibuprofen (ADVIL,MOTRIN) 800 MG tablet Take 1 tablet (800 mg total) by mouth 3 (three) times daily. 01/05/15   Forcucci, Toni Amendourtney, PA-C    Family History History reviewed. No pertinent family history.  Social History Social History   Tobacco Use  . Smoking status: Current Some Day Smoker    Types: Cigarettes  . Smokeless tobacco: Never Used  Substance Use Topics  . Alcohol use: Yes  . Drug use: Yes    Types: Marijuana     Allergies   Patient has no known allergies.   Review of Systems Review of Systems  All other systems reviewed and are negative.    Physical Exam Updated Vital Signs BP (!) 158/92   Pulse 67   Temp 98.1 F (36.7 C) (Oral)   Resp 14   Ht 6\' 5"  (1.956 m)   Wt 83.9 kg (185 lb)   SpO2 99%   BMI 21.94 kg/m   Physical Exam  Constitutional: He is oriented to person, place, and time. He appears well-developed and well-nourished.  HENT:  Head: Normocephalic and atraumatic.  Eyes: Conjunctivae are normal.  Neck: Neck supple.  Musculoskeletal: Normal range of motion.  Neurological: He is alert and oriented to  person, place, and time.  Skin:  Right upper back: Area of induration and erythema approximately 3 cm in diameter with central core draining pus.  Psychiatric: He has a normal mood and affect. His behavior is normal.  Nursing note and vitals reviewed.    ED Treatments / Results  Labs (all labs ordered are listed, but only abnormal results are displayed) Labs Reviewed - No data to display  EKG None  Radiology No results found.  Procedures Procedures (including critical care time)  Medications Ordered in ED Medications  doxycycline (VIBRA-TABS) tablet 100 mg (100 mg Oral Given 05/24/18 2056)     Initial Impression / Assessment and Plan / ED Course  I have reviewed the triage vital signs and the nursing notes.  Pertinent labs & imaging results that were available during my care of the patient were reviewed by me and considered in my medical decision making (see chart for details).     Patient has obvious cellulitis/abscess on upper back.  No I&D necessary.  Will Rx doxycycline.  Final Clinical Impressions(s) / ED Diagnoses   Final diagnoses:  Abscess of back    ED Discharge Orders        Ordered    doxycycline (VIBRAMYCIN) 100 MG capsule  2 times daily     05/24/18 2109  Donnetta Hutching, MD 05/24/18 2123

## 2018-05-24 NOTE — ED Triage Notes (Signed)
Pt states spider bite 4-5days. Abscess draining currently and since second day, pt admits to squeezing it.  Pt states with chills and nausea, decrease appetite.

## 2019-12-10 ENCOUNTER — Emergency Department (HOSPITAL_COMMUNITY)
Admission: EM | Admit: 2019-12-10 | Discharge: 2019-12-10 | Disposition: A | Discharge status: Home / Self Care | Payer: Self-pay | Attending: Emergency Medicine | Admitting: Emergency Medicine

## 2019-12-10 ENCOUNTER — Emergency Department (HOSPITAL_COMMUNITY): Payer: Self-pay

## 2019-12-10 ENCOUNTER — Other Ambulatory Visit: Payer: Self-pay

## 2019-12-10 DIAGNOSIS — R300 Dysuria: Secondary | ICD-10-CM | POA: Insufficient documentation

## 2019-12-10 DIAGNOSIS — Z202 Contact with and (suspected) exposure to infections with a predominantly sexual mode of transmission: Secondary | ICD-10-CM | POA: Insufficient documentation

## 2019-12-10 DIAGNOSIS — F17210 Nicotine dependence, cigarettes, uncomplicated: Secondary | ICD-10-CM | POA: Insufficient documentation

## 2019-12-10 DIAGNOSIS — Z79899 Other long term (current) drug therapy: Secondary | ICD-10-CM | POA: Insufficient documentation

## 2019-12-10 DIAGNOSIS — Z113 Encounter for screening for infections with a predominantly sexual mode of transmission: Secondary | ICD-10-CM

## 2019-12-10 DIAGNOSIS — R319 Hematuria, unspecified: Secondary | ICD-10-CM | POA: Insufficient documentation

## 2019-12-10 LAB — I-STAT CHEM 8, ED
BUN: 8 mg/dL (ref 6–20)
Calcium, Ion: 1.19 mmol/L (ref 1.15–1.40)
Chloride: 100 mmol/L (ref 98–111)
Creatinine, Ser: 1.1 mg/dL (ref 0.61–1.24)
Glucose, Bld: 88 mg/dL (ref 70–99)
HCT: 51 % (ref 39.0–52.0)
Hemoglobin: 17.3 g/dL — ABNORMAL HIGH (ref 13.0–17.0)
Potassium: 4.2 mmol/L (ref 3.5–5.1)
Sodium: 139 mmol/L (ref 135–145)
TCO2: 29 mmol/L (ref 22–32)

## 2019-12-10 LAB — URINALYSIS, ROUTINE W REFLEX MICROSCOPIC
Bilirubin Urine: NEGATIVE
Glucose, UA: NEGATIVE mg/dL
Ketones, ur: NEGATIVE mg/dL
Leukocytes,Ua: NEGATIVE
Nitrite: NEGATIVE
Protein, ur: NEGATIVE mg/dL
Specific Gravity, Urine: 1.009 (ref 1.005–1.030)
pH: 7 (ref 5.0–8.0)

## 2019-12-10 MED ORDER — AZITHROMYCIN 250 MG PO TABS
1000.0000 mg | ORAL_TABLET | Freq: Once | ORAL | Status: AC
  Administered 2019-12-10: 17:00:00 1000 mg via ORAL
  Filled 2019-12-10: qty 4

## 2019-12-10 MED ORDER — CEFTRIAXONE SODIUM 500 MG IJ SOLR
500.0000 mg | Freq: Once | INTRAMUSCULAR | Status: AC
  Administered 2019-12-10: 17:00:00 500 mg via INTRAMUSCULAR
  Filled 2019-12-10: qty 500

## 2019-12-10 NOTE — ED Notes (Signed)
Chem 8 did not cross  Printed and given to PA  Pt to CT

## 2019-12-10 NOTE — ED Notes (Signed)
Reports buring w urination since this am   "scared me" per pt report  Here for eval   Denies discharge   Spec to lab

## 2019-12-10 NOTE — ED Triage Notes (Signed)
Pt with burning with urination and blood noted in urine since voiding this morning, denies any penile discharge.

## 2019-12-10 NOTE — Discharge Instructions (Signed)
Follow up with primary care provider for blood in your urine.  If your STD seen is positive you will need to notify your partners.  She sexual intercourse for 1 week after treatment.

## 2019-12-10 NOTE — ED Provider Notes (Signed)
[facility] EMERGENCY DEPARTMENT Provider Note   CSN: [id number] Arrival date & time: 12/10/19  1409    History Chief Complaint  Patient presents with  . Dysuria    Alexander Stein is a 34 y.o. male with past medical history significant for gonorrhea, chlamydia who presents for evaluation of dysuria.  Patient states he woke up this morning had one episode of dysuria.  Ports being sexually active intermittent use protection.  Patient states during his 1 episode of dysuria he also had hematuria.  Patient states when he urinated here in the emergency department he denies any hematuria or dysuria.  He denies any penile discharge, pain, swelling, scrotal pain, testicular pain, genital lesions, abdominal pain, known history of stones or pyelonephritis.  Denies additional aggravating or alleviating factors.  He does not want HIV, syphilis testing at this time.  History obtained from patient and past medical records.  No interpreter is used.  HPI     No past medical history on file.  There are no problems to display for this patient.   No past surgical history on file.     No family history on file.  Social History   Tobacco Use  . Smoking status: Current Some Day Smoker    Types: Cigarettes  . Smokeless tobacco: Never Used  Substance Use Topics  . Alcohol use: Yes  . Drug use: Yes    Types: Marijuana    Home Medications Prior to Admission medications   Medication Sig Start Date End Date Taking? Authorizing Provider  doxycycline (VIBRAMYCIN) 100 MG capsule Take 1 capsule (100 mg total) by mouth 2 (two) times daily. 05/24/18   Donnetta Hutching, MD  ibuprofen (ADVIL,MOTRIN) 800 MG tablet Take 1 tablet (800 mg total) by mouth 3 (three) times daily. 01/05/15   Shirleen Schirmer, PA-C    Allergies    Patient has no known allergies.  Review of Systems   Review of Systems  Constitutional: Negative.   HENT: Negative.   Respiratory: Negative.   Cardiovascular: Negative.     Gastrointestinal: Negative.   Genitourinary: Positive for dysuria and hematuria. Negative for decreased urine volume, difficulty urinating, discharge, enuresis, flank pain, frequency, genital sores, penile pain, penile swelling, scrotal swelling, testicular pain and urgency.  Musculoskeletal: Negative.   Skin: Negative.   Neurological: Negative.   All other systems reviewed and are negative.   Physical Exam Updated Vital Signs BP (!) 162/98 (BP Location: Right Arm)   Pulse 63   Temp (!) 97.2 F (36.2 C) (Temporal)   Resp 12   Ht 6\' 5"  (1.956 m)   Wt 81.6 kg   SpO2 100%   BMI 21.34 kg/m   Physical Exam Vitals and nursing note reviewed.  Constitutional:      General: He is not in acute distress.    Appearance: He is well-developed. He is not ill-appearing, toxic-appearing or diaphoretic.  HENT:     Head: Normocephalic and atraumatic.     Nose: Nose normal.     Mouth/Throat:     Mouth: Mucous membranes are moist.     Pharynx: Oropharynx is clear.  Eyes:     Pupils: Pupils are equal, round, and reactive to light.  Cardiovascular:     Rate and Rhythm: Normal rate and regular rhythm.     Pulses: Normal pulses.     Heart sounds: Normal heart sounds.  Pulmonary:     Effort: Pulmonary effort is normal. No respiratory distress.     Breath sounds: Normal breath  sounds.  Abdominal:     General: Bowel sounds are normal. There is no distension.     Palpations: Abdomen is soft.     Tenderness: There is no abdominal tenderness. There is no guarding or rebound.     Hernia: No hernia is present.     Comments: Negative CVA tap bilaterally.  Soft, nontender without rebound or guarding. No overlying skin changes to abdominal wall.  Genitourinary:    Comments: Refused GU exam Musculoskeletal:        General: Normal range of motion.     Cervical back: Normal range of motion and neck supple.     Comments: No Midline spinal tenderness, no overlying skin changes to thoracic or lumbar  back.  Skin:    General: Skin is warm and dry.     Capillary Refill: Capillary refill takes less than 2 seconds.     Comments: Brisk capillary refill.  No rashes or lesions.  Neurological:     Mental Status: He is alert.     Comments: Ambulatory in room without difficulty.    ED Results / Procedures / Treatments   Labs (all labs ordered are listed, but only abnormal results are displayed) Labs Reviewed  URINALYSIS, ROUTINE W REFLEX MICROSCOPIC - Abnormal; Notable for the following components:      Result Value   Hgb urine dipstick MODERATE (*)    Bacteria, UA RARE (*)    All other components within normal limits  I-STAT CHEM 8, ED - Abnormal; Notable for the following components:   Hemoglobin 17.3 (*)    All other components within normal limits  GC/CHLAMYDIA PROBE AMP ([place]) NOT AT [facility]      EKG None  Radiology CT Renal Stone Study  Result Date: 12/10/2019 CLINICAL DATA:  Burning with urination and hematuria. EXAM: CT ABDOMEN AND PELVIS WITHOUT CONTRAST TECHNIQUE: Multidetector CT imaging of the abdomen and pelvis was performed following the standard protocol without IV contrast. COMPARISON:  None. FINDINGS: Lower chest: Unremarkable. Hepatobiliary: No focal abnormality in the liver on this study without intravenous contrast. There is no evidence for gallstones, gallbladder wall thickening, or pericholecystic fluid. No intrahepatic or extrahepatic biliary dilation. Pancreas: No focal mass lesion. No dilatation of the main duct. No intraparenchymal cyst. No peripancreatic edema. Spleen: No splenomegaly. No focal mass lesion. Adrenals/Urinary Tract: No adrenal nodule or mass. No stones are seen in either kidney or ureter. No secondary changes in either kidney or ureter. No bladder stones. Stomach/Bowel: Stomach is unremarkable. No gastric wall thickening. No evidence of outlet obstruction. Duodenum is normally positioned as is the ligament of Treitz. No small bowel wall  thickening. No small bowel dilatation. No gross colonic mass. No colonic wall thickening. Vascular/Lymphatic: There is abdominal aortic atherosclerosis without aneurysm. There is no gastrohepatic or hepatoduodenal ligament lymphadenopathy. No retroperitoneal or mesenteric lymphadenopathy. No pelvic sidewall lymphadenopathy. Reproductive: The prostate gland and seminal vesicles are unremarkable. Other: No intraperitoneal free fluid. Musculoskeletal: No worrisome lytic or sclerotic osseous abnormality. IMPRESSION: 1. No acute findings in the abdomen or pelvis. Specifically, no evidence for urinary stone disease. No findings to explain the patient's history of hematuria on this noncontrast exam. Electronically Signed   By: Misty Stanley M.D.   On: 12/10/2019 16:11    Procedures Procedures (including critical care time)  Medications Ordered in ED Medications  cefTRIAXone (ROCEPHIN) injection 500 mg (has no administration in time range)  azithromycin (ZITHROMAX) tablet 1,000 mg (has no administration in time range)  ED Course  I have reviewed the triage vital signs and the nursing notes.  Pertinent labs & imaging results that were available during my care of the patient were reviewed by me and considered in my medical decision making (see chart for details).  34 year old presents for dysuria and hematuria. Afebrile non septic, non ill appearing. No concerns for STDs. Refused GU exam. Abd soft. No flank pain. No prior hx of stone, pyelonephritis, CKD.  UA with moderate blood, rare bacteria GC/ Chlamydia pending. Does not want empiric abx for STD.  1520: Revaluation patient now states that he is having back pain. Will order CT AP Wo to assess for stone, cause of hematuria. Will also order creatinine to check for kidney function. Does not want anything for pain.  I-STAT not pulling over into computer.  Normal creatinine. CT Stone without acute findings  Discussed results with patient.  He is now  requesting empiric treatment for STI.  Tolerating p.o. intake without difficulty.  No red flags for back pain.  He denies similar symptoms in this a few years ago per medical records and ended up being positive for gonorrhea and chlamydia.  He will follow up with PCP for reevaluation of his hematuria.  Patient is afebrile without abdominal tenderness, abdominal pain or painful bowel movements to indicate prostatitis.  STD cultures obtained gonorrhea and chlamydia. Declines HIV, RPR. Patient to be discharged with instructions to follow up with PCP. Discussed importance of using protection when sexually active. Pt understands that they have GC/Chlamydia cultures pending and that they will need to inform all sexual partners if results return positive. Patient has been treated prophylactically with azithromycin and Rocephin. \  The patient has been appropriately medically screened and/or stabilized in the ED. I have low suspicion for any other emergent medical condition which would require further screening, evaluation or treatment in the ED or require inpatient management.  Patient is hemodynamically stable and in no acute distress.  Patient able to ambulate in department prior to ED.  Evaluation does not show acute pathology that would require ongoing or additional emergent interventions while in the emergency department or further inpatient treatment.  I have discussed the diagnosis with the patient and answered all questions.  Pain is been managed while in the emergency department and patient has no further complaints prior to discharge.  Patient is comfortable with plan discussed in room and is stable for discharge at this time.  I have discussed strict return precautions for returning to the emergency department.  Patient was encouraged to follow-up with PCP/specialist refer to at discharge.    MDM Rules/Calculators/A&P                       Final Clinical Impression(s) / ED Diagnoses Final  diagnoses:  Dysuria  Hematuria, unspecified type  Screen for STD (sexually transmitted disease)    Rx / DC Orders ED Discharge Orders    None       Zamya Culhane A, PA-C 12/10/19 1631    Linwood Dibbles, MD 12/12/19 1240

## 2020-07-07 IMAGING — CT CT RENAL STONE PROTOCOL
2 of 4 series · 16 of 46 positions shown, 18 images · non-contrast
Comparison: None.

CLINICAL DATA: Burning with urination and hematuria.

EXAM:
CT ABDOMEN AND PELVIS WITHOUT CONTRAST
TECHNIQUE: Multidetector CT imaging of the abdomen and pelvis was performed
following the standard protocol without IV contrast.

[Series 2: axial st · axial · 0.77mm/px · z∈[+910,+1290]mm · 13 of 88 slices shown, 15 images]
[im 6/88  soft-tissue]
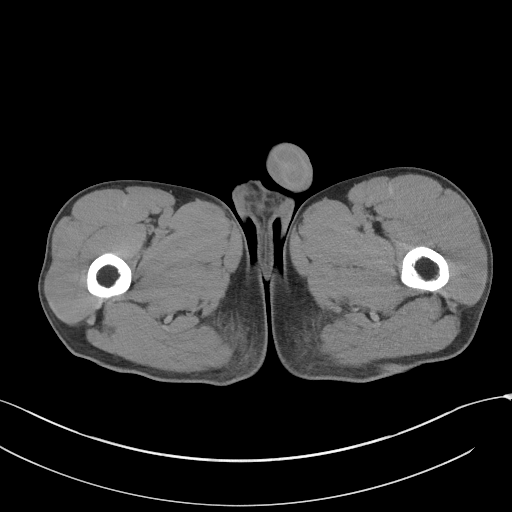
[im 6/88  bone]
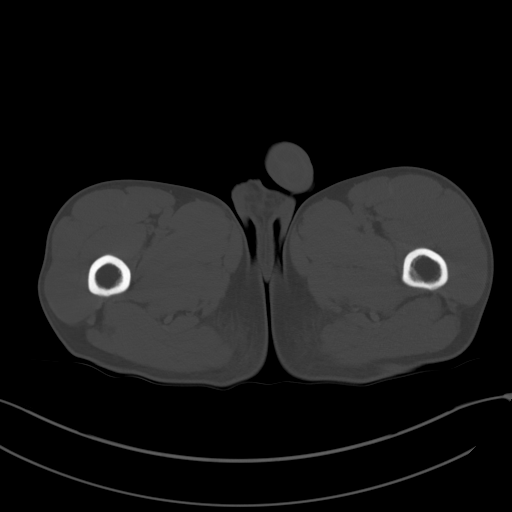
[im 11/88  soft-tissue]
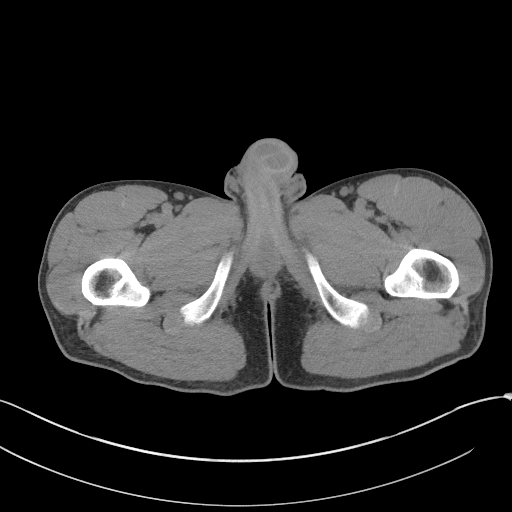
[im 17/88  soft-tissue]
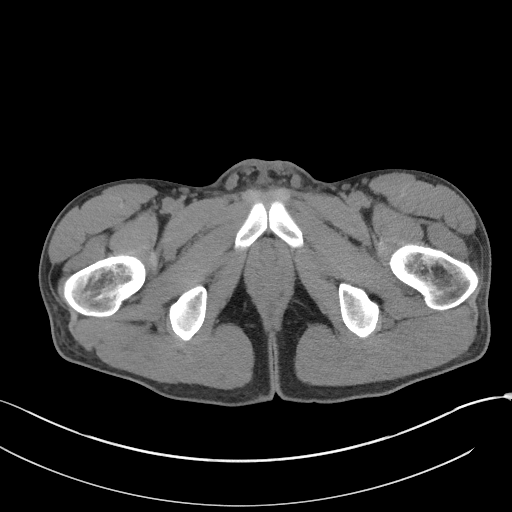
[im 28/88  soft-tissue]
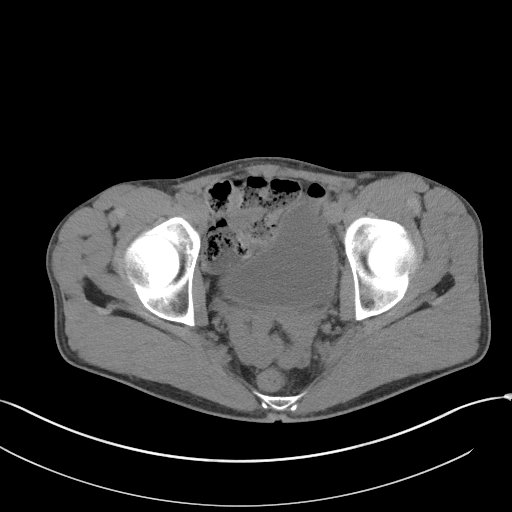
[im 33/88  soft-tissue]
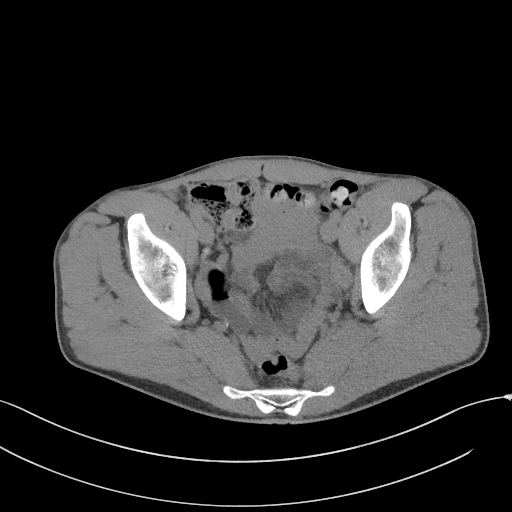
[im 39/88  soft-tissue]
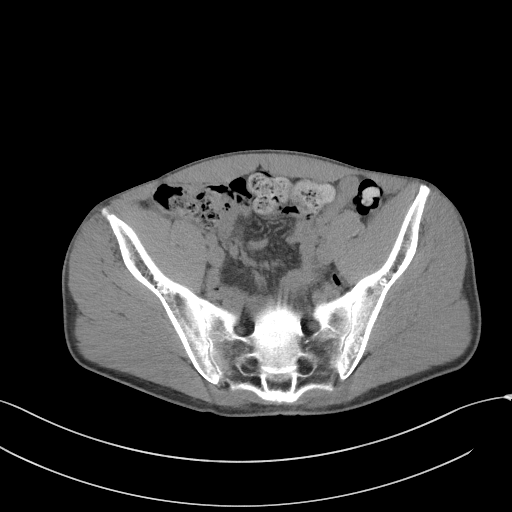
[im 44/88  soft-tissue]
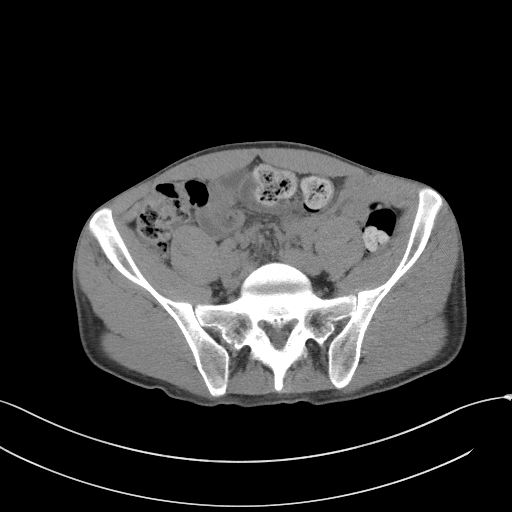
[im 49/88  soft-tissue]
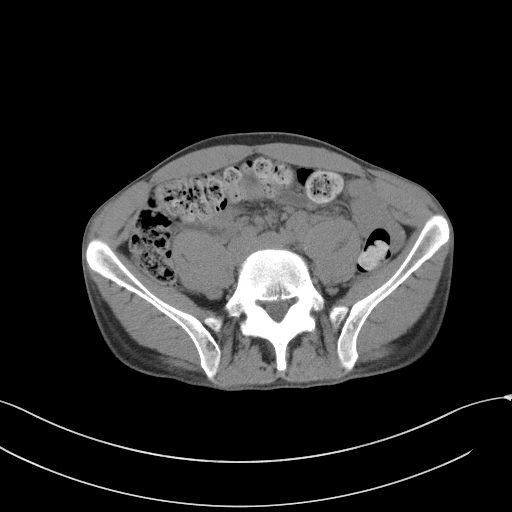
[im 55/88  soft-tissue]
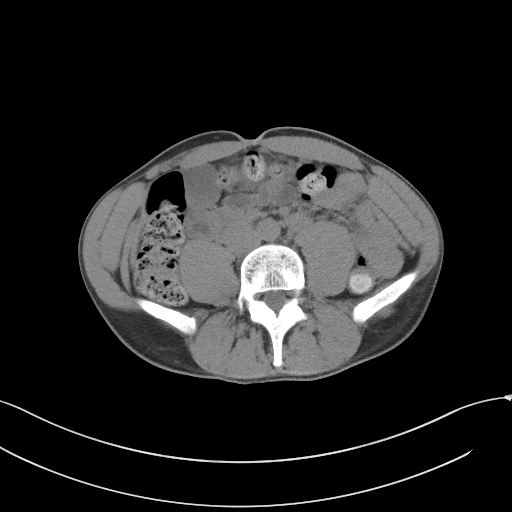
[im 55/88  bone]
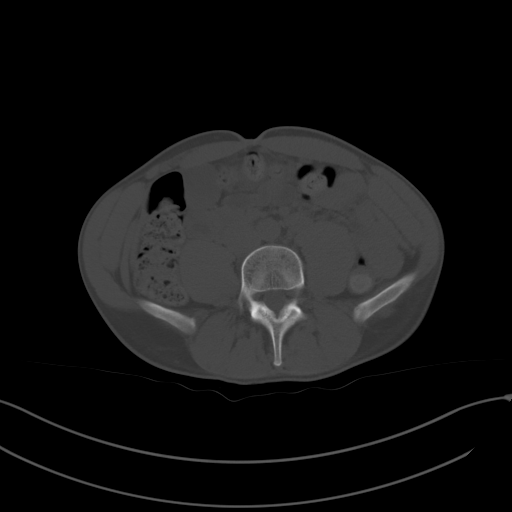
[im 60/88  soft-tissue]
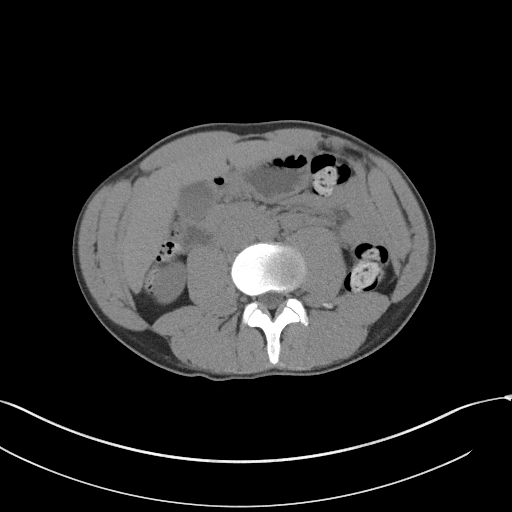
[im 71/88  soft-tissue]
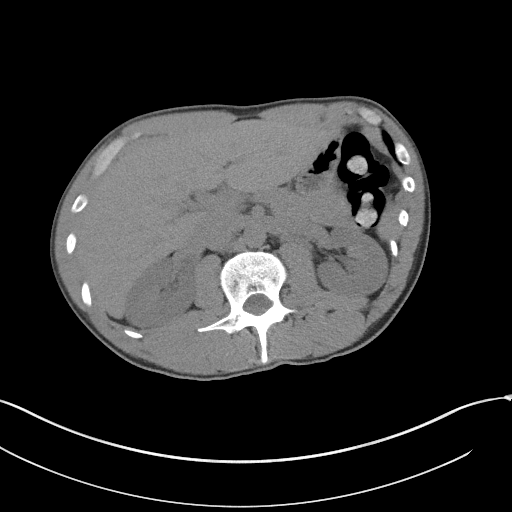
[im 77/88  soft-tissue]
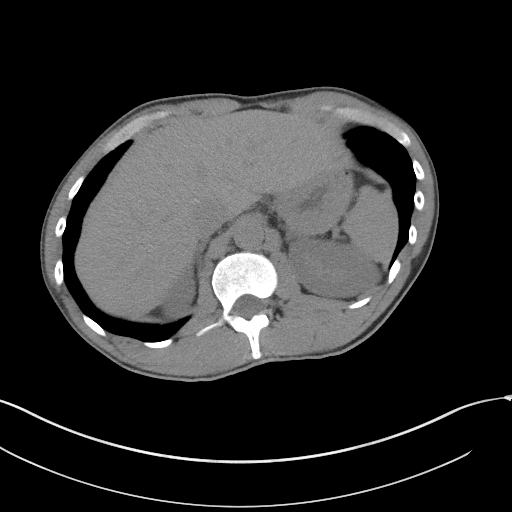
[im 82/88  soft-tissue]
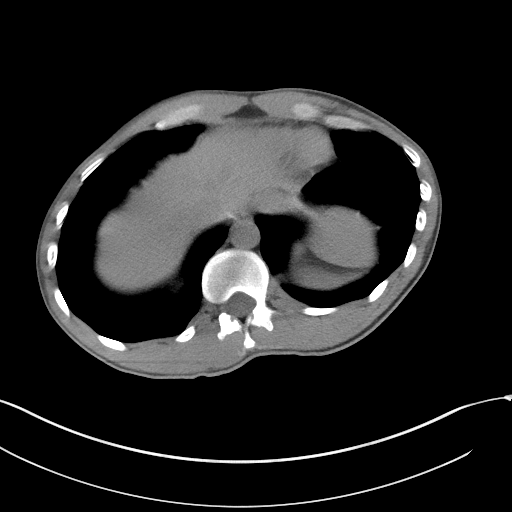

[Series 5: coronal st · coronal · 0.73mm/px · 3 of 91 slices shown]
[im 31/91  soft-tissue]
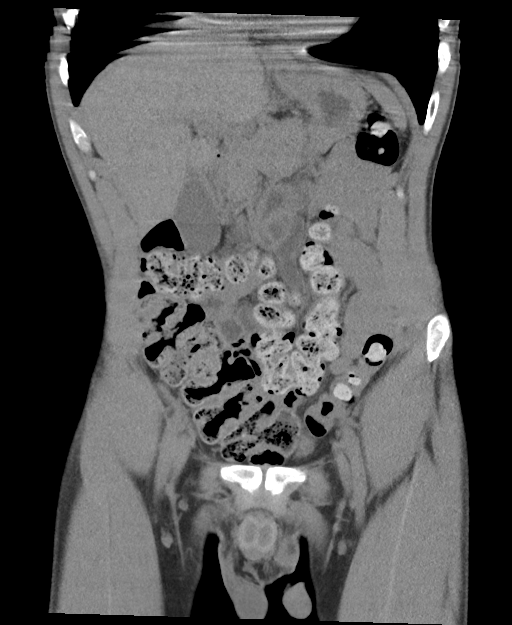
[im 41/91  soft-tissue]
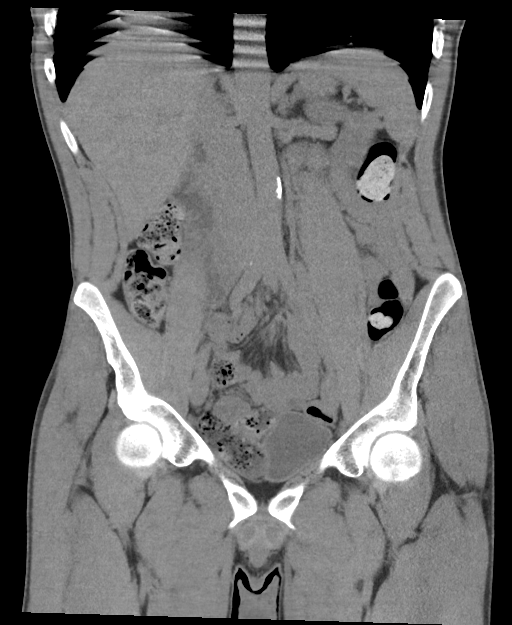
[im 51/91  soft-tissue]
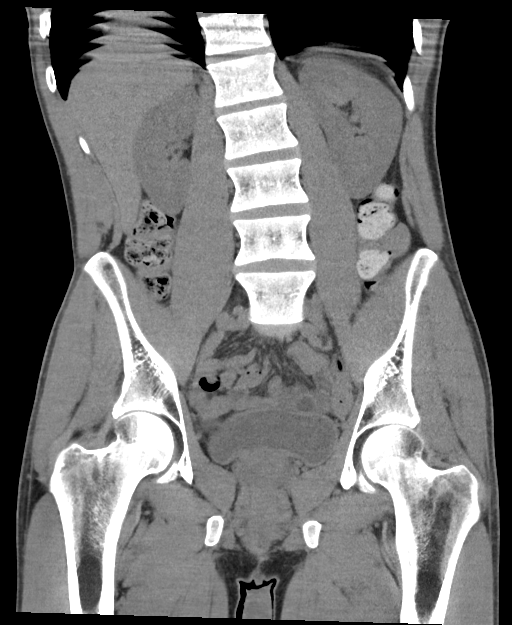

[16 of 46 positions shown; findings below may reference images not displayed]

FINDINGS: Lower chest: Unremarkable.

Hepatobiliary: No focal abnormality in the liver on this study
without intravenous contrast. There is no evidence for gallstones,
gallbladder wall thickening, or pericholecystic fluid. No
intrahepatic or extrahepatic biliary dilation.

Pancreas: No focal mass lesion. No dilatation of the main duct. No
intraparenchymal cyst. No peripancreatic edema.

Spleen: No splenomegaly. No focal mass lesion.

Adrenals/Urinary Tract: No adrenal nodule or mass. No stones are
seen in either kidney or ureter. No secondary changes in either
kidney or ureter. No bladder stones.

Stomach/Bowel: Stomach is unremarkable. No gastric wall thickening.
No evidence of outlet obstruction. Duodenum is normally positioned
as is the ligament of Treitz. No small bowel wall thickening. No
small bowel dilatation. No gross colonic mass. No colonic wall
thickening.

Vascular/Lymphatic: There is abdominal aortic atherosclerosis
without aneurysm. There is no gastrohepatic or hepatoduodenal
ligament lymphadenopathy. No retroperitoneal or mesenteric
lymphadenopathy. No pelvic sidewall lymphadenopathy.

Reproductive: The prostate gland and seminal vesicles are
unremarkable.

Other: No intraperitoneal free fluid.

Musculoskeletal: No worrisome lytic or sclerotic osseous
abnormality.
IMPRESSION: 1. No acute findings in the abdomen or pelvis. Specifically, no
evidence for urinary stone disease. No findings to explain the
patient's history of hematuria on this noncontrast exam.

## 2021-11-16 DEATH — deceased
